# Patient Record
Sex: Male | Born: 1935 | Race: White | Hispanic: No | Smoking: Never smoker
Health system: Southern US, Community
[De-identification: ages and names within clinical notes are randomized; demographics above are authoritative.]

## PROBLEM LIST (undated history)

## (undated) DIAGNOSIS — Z87438 Personal history of other diseases of male genital organs: Secondary | ICD-10-CM

## (undated) DIAGNOSIS — Z79899 Other long term (current) drug therapy: Secondary | ICD-10-CM

## (undated) DIAGNOSIS — E559 Vitamin D deficiency, unspecified: Secondary | ICD-10-CM

## (undated) DIAGNOSIS — Z87442 Personal history of urinary calculi: Secondary | ICD-10-CM

## (undated) DIAGNOSIS — R350 Frequency of micturition: Secondary | ICD-10-CM

## (undated) DIAGNOSIS — R030 Elevated blood-pressure reading, without diagnosis of hypertension: Secondary | ICD-10-CM

## (undated) DIAGNOSIS — C61 Malignant neoplasm of prostate: Secondary | ICD-10-CM

## (undated) DIAGNOSIS — N529 Male erectile dysfunction, unspecified: Secondary | ICD-10-CM

## (undated) HISTORY — PX: EXTRACORPOREAL SHOCK WAVE LITHOTRIPSY: SHX1557

## (undated) HISTORY — DX: Vitamin D deficiency, unspecified: E55.9

## (undated) HISTORY — PX: OTHER SURGICAL HISTORY: SHX169

## (undated) HISTORY — DX: Other long term (current) drug therapy: Z79.899

---

## 1998-05-25 ENCOUNTER — Ambulatory Visit (HOSPITAL_COMMUNITY): Admission: RE | Admit: 1998-05-25 | Discharge: 1998-05-25 | Payer: Self-pay | Admitting: Urology

## 1998-06-15 ENCOUNTER — Ambulatory Visit (HOSPITAL_COMMUNITY): Admission: RE | Admit: 1998-06-15 | Discharge: 1998-06-15 | Payer: Self-pay | Admitting: Urology

## 1998-12-24 ENCOUNTER — Emergency Department (HOSPITAL_COMMUNITY): Admission: EM | Admit: 1998-12-24 | Discharge: 1998-12-24 | Payer: Self-pay | Admitting: Emergency Medicine

## 1998-12-24 ENCOUNTER — Encounter: Payer: Self-pay | Admitting: Emergency Medicine

## 1999-05-02 ENCOUNTER — Encounter: Payer: Self-pay | Admitting: Internal Medicine

## 1999-05-02 ENCOUNTER — Ambulatory Visit (HOSPITAL_COMMUNITY): Admission: RE | Admit: 1999-05-02 | Discharge: 1999-05-02 | Payer: Self-pay | Admitting: Internal Medicine

## 1999-12-04 ENCOUNTER — Encounter (INDEPENDENT_AMBULATORY_CARE_PROVIDER_SITE_OTHER): Payer: Self-pay

## 1999-12-04 ENCOUNTER — Other Ambulatory Visit: Admission: RE | Admit: 1999-12-04 | Discharge: 1999-12-04 | Payer: Self-pay | Admitting: Gastroenterology

## 2001-02-12 ENCOUNTER — Ambulatory Visit (HOSPITAL_COMMUNITY): Admission: RE | Admit: 2001-02-12 | Discharge: 2001-02-12 | Payer: Self-pay | Admitting: Urology

## 2001-02-12 ENCOUNTER — Encounter: Payer: Self-pay | Admitting: Urology

## 2003-10-27 ENCOUNTER — Encounter: Admission: RE | Admit: 2003-10-27 | Discharge: 2003-10-27 | Payer: Self-pay | Admitting: Internal Medicine

## 2004-01-02 ENCOUNTER — Ambulatory Visit (HOSPITAL_COMMUNITY): Admission: RE | Admit: 2004-01-02 | Discharge: 2004-01-02 | Payer: Self-pay | Admitting: Urology

## 2005-05-07 ENCOUNTER — Ambulatory Visit: Payer: Self-pay | Admitting: Internal Medicine

## 2005-08-22 ENCOUNTER — Ambulatory Visit (HOSPITAL_COMMUNITY): Admission: RE | Admit: 2005-08-22 | Discharge: 2005-08-22 | Payer: Self-pay | Admitting: Urology

## 2006-05-27 ENCOUNTER — Ambulatory Visit: Payer: Self-pay | Admitting: Gastroenterology

## 2006-06-03 ENCOUNTER — Encounter (INDEPENDENT_AMBULATORY_CARE_PROVIDER_SITE_OTHER): Payer: Self-pay | Admitting: Gastroenterology

## 2006-06-03 ENCOUNTER — Ambulatory Visit: Payer: Self-pay | Admitting: Gastroenterology

## 2007-02-05 ENCOUNTER — Ambulatory Visit (HOSPITAL_COMMUNITY): Admission: RE | Admit: 2007-02-05 | Discharge: 2007-02-05 | Payer: Self-pay | Admitting: Urology

## 2007-03-26 ENCOUNTER — Ambulatory Visit (HOSPITAL_COMMUNITY): Admission: RE | Admit: 2007-03-26 | Discharge: 2007-03-26 | Payer: Self-pay | Admitting: Urology

## 2007-06-10 ENCOUNTER — Emergency Department (HOSPITAL_COMMUNITY): Admission: EM | Admit: 2007-06-10 | Discharge: 2007-06-10 | Payer: Self-pay | Admitting: Emergency Medicine

## 2009-05-24 ENCOUNTER — Encounter (INDEPENDENT_AMBULATORY_CARE_PROVIDER_SITE_OTHER): Payer: Self-pay | Admitting: *Deleted

## 2009-11-21 ENCOUNTER — Ambulatory Visit (HOSPITAL_BASED_OUTPATIENT_CLINIC_OR_DEPARTMENT_OTHER): Admission: RE | Admit: 2009-11-21 | Discharge: 2009-11-21 | Payer: Self-pay | Admitting: Urology

## 2011-03-21 ENCOUNTER — Other Ambulatory Visit: Payer: Self-pay | Admitting: Urology

## 2011-03-21 ENCOUNTER — Ambulatory Visit
Admission: RE | Admit: 2011-03-21 | Discharge: 2011-03-21 | Disposition: A | Discharge status: Home / Self Care | Payer: Medicare Other | Source: Ambulatory Visit | Attending: Urology | Admitting: Urology

## 2011-03-21 DIAGNOSIS — Z01811 Encounter for preprocedural respiratory examination: Secondary | ICD-10-CM

## 2011-03-22 ENCOUNTER — Other Ambulatory Visit: Payer: Self-pay | Admitting: Urology

## 2011-03-22 ENCOUNTER — Ambulatory Visit (HOSPITAL_BASED_OUTPATIENT_CLINIC_OR_DEPARTMENT_OTHER)
Admission: RE | Admit: 2011-03-22 | Discharge: 2011-03-22 | Disposition: A | Discharge status: Home / Self Care | Payer: Medicare Other | Source: Ambulatory Visit | Attending: Urology | Admitting: Urology

## 2011-03-22 DIAGNOSIS — Z01810 Encounter for preprocedural cardiovascular examination: Secondary | ICD-10-CM | POA: Insufficient documentation

## 2011-03-22 DIAGNOSIS — Z01812 Encounter for preprocedural laboratory examination: Secondary | ICD-10-CM | POA: Insufficient documentation

## 2011-03-22 DIAGNOSIS — C61 Malignant neoplasm of prostate: Secondary | ICD-10-CM | POA: Insufficient documentation

## 2011-03-22 LAB — POCT HEMOGLOBIN-HEMACUE: Hemoglobin: 16.5 g/dL (ref 13.0–17.0)

## 2011-03-28 NOTE — Op Note (Signed)
  Dwayne Stevenson, Dwayne Stevenson                 ACCOUNT NO.:  [id number]  MEDICAL RECORD NO.:  [id number]            PATIENT TYPE:  LOCATION:                                 FACILITY:  PHYSICIAN:  Maretta Bees. Vonita Moss, M.D.     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  PROCEDURE:  Transurethral ultrasound and biopsy the prostate.  SURGEON:  Maretta Bees. Vonita Moss, M.D.  ANESTHESIA:  MAC.  INDICATIONS:  This gentleman has had previous biopsies because of pre PSA elevation and he has had high-grade PIN and a focus of atypia and he needs followup biopsy done at this time.  DESCRIPTION OF PROCEDURE:  The patient was brought to the operating room and placed in a lateral decubitus position and given MAC.  Transrectal ultrasound probe was inserted in the rectum.  The prostate was scanned and he had prostatic calcifications but no definite hypoechoic area and the prostatic volume of just over 50 cc.  Transrectal ultrasound-guided biopsies were taken from the mid base and apex bilaterally with medial and lateral biopsies in each area for a total of 12 cores in 12 bottles. He was then taken to the recovery room in good condition having tolerated the procedure well.     Maretta Bees. Vonita Moss, M.D.     LJP/MEDQ  D:  03/22/2011  T:  03/22/2011  Job:  [id number]  Electronically Signed by Larey Dresser M.D. on 03/28/2011 02:26:47 PM

## 2011-07-12 ENCOUNTER — Encounter: Payer: Self-pay | Admitting: Gastroenterology

## 2012-10-01 ENCOUNTER — Encounter: Payer: Self-pay | Admitting: Gastroenterology

## 2013-02-05 ENCOUNTER — Other Ambulatory Visit: Payer: Self-pay | Admitting: Urology

## 2013-05-31 ENCOUNTER — Encounter (HOSPITAL_COMMUNITY): Admission: RE | Payer: Self-pay | Source: Ambulatory Visit

## 2013-05-31 ENCOUNTER — Ambulatory Visit (HOSPITAL_COMMUNITY): Admission: RE | Admit: 2013-05-31 | Payer: Medicare Other | Source: Ambulatory Visit | Admitting: Urology

## 2013-05-31 SURGERY — BIOPSY, PROSTATE, RECTAL APPROACH, WITH US GUIDANCE
Anesthesia: Monitor Anesthesia Care

## 2013-10-26 ENCOUNTER — Other Ambulatory Visit: Payer: Self-pay | Admitting: Urology

## 2013-11-29 ENCOUNTER — Encounter (HOSPITAL_BASED_OUTPATIENT_CLINIC_OR_DEPARTMENT_OTHER): Payer: Self-pay | Admitting: *Deleted

## 2013-11-29 NOTE — Progress Notes (Signed)
NPO AFTER MN. ARRIVE AT 0845. NEEDS HG . WILL DO FLEET ENEMA AM DOS. 

## 2013-12-01 NOTE — H&P (Signed)
  History of Present Illness  Dwayne Stevenson is a 78 year old previously followed by Dr. Vonita Moss and Dr. Annabell Howells with the following urologic history:  1) Prostate cancer: He was diagnosed with prostate cancer in November 2010 by Dr. Vonita Moss and found to have very low risk clinically localized prostate cancer at that time.  After discussing options for management, he elected to proceed with active surveillance.  Initial diagnosis: November 2010 TNM stage: cT1c Nx Mx PSA at diagnosis: 6.77 Gleason score: 3+3=6 Biopsy (11/21/09): 12 core biopsy -- 1 out of 12 cores positive -- L apex (8%), HGPIN, chronic inflammation, Vol 51 cc PSAD: 0.13  Surveillance: Mar 2012: 12 core biopsy -- R lateral apex (3+3=6, 10%), L lateral base (focus of atypia), Vol 58 cc  2) BPH/LUTS: His baseline symptoms include frequency, urgency, hesitancy, and weak stream. Current treatment: Tamsulosin 0.4 mg  3) Urolithiasis: He has a long standing history of calcium oxalate urolithiasis.   Prior treatment: 02/12/11: R ESWL 01/02/04: R ESWL  08/22/05: R ESWL 03/26/07: R ESWL  4) Erectile dysfunction:  Current treatment: Viagra 50 mg prn      Past Medical History Problems  1. Benign Prostatic Hypertrophy With Urinary Obstruction 600.01 2. Nephrolithiasis 592.0 3. Prostate Cancer 185  Surgical History Problems  1. History of  Biopsy Of The Prostate Needle 2. History of  Biopsy Of The Prostate Needle 3. History of  Lithotripsy  Current Meds 1. Multi-Vitamin TABS; Therapy: (Recorded:12Jun2013) to 2. Tamsulosin HCl 0.4 MG Oral Capsule; Take 1 capsule by mouth at bedtime; Therapy:  12Jun2013 to (Evaluate:07Jun2014); Last Rx:12Jun2013 3. Viagra 100 MG Oral Tablet; TAKE AS DIRECTED; Therapy: 02Dec2008 to (Last  Rx:12Jun2013)  Allergies Medication  1. No Known Drug Allergies  Family History Problems  1. Paternal history of  Death In The Family Father Old AgeAge of death was 87 2. Maternal history of  Death In  The Family Mother Old Age Age of death was 56 3. Family history of  Family Health Status Number Of Children 1 son and 3 daughters  Social History Problems  1. Alcohol Use 2. Marital History - Single 3. Never A Smoker 4. Occupation: ret. Denied  5. Tobacco Use V15.82  Review of Systems  Genitourinary: no hematuria.  Constitutional: no fever and no recent weight loss.      Physical Exam Constitutional: Well nourished and well developed . No acute distress.  Rectal: Rectal exam demonstrates normal sphincter tone, no tenderness and no masses. Prostate size is estimated to be 65 g. The prostate has no nodularity and is not tender. The left seminal vesicle is nonpalpable. The right seminal vesicle is nonpalpable. The perineum is normal on inspection.     Selected Results  PSA 16Dec2013 09:26AM Heloise Purpura  SPECIMEN TYPE: BLOOD   Test Name Result Flag Reference  PSA 6.15 ng/mL H <=4.00  Test Methodology: ECLIA PSA (Electrochemiluminescence Immunoassay)   PSA Flowsheet [Data Includes: Last 3 Years]   ** Printed in Appendix #1 below.    PVR: 52 cc  Assessment Assessed  1. Prostate Cancer 185   Discussion/Summary  1. Prostate cancer:    I have recommended proceeding with a prostate needle biopsy under transrectal ultrasound guidance. We have discussed the potential risks and complications of this procedure including but not limited to bleeding, infection (in rare instances sepsis), urinary retention, and failure to diagnosis cancer.

## 2013-12-02 ENCOUNTER — Ambulatory Visit (HOSPITAL_BASED_OUTPATIENT_CLINIC_OR_DEPARTMENT_OTHER)
Admission: RE | Admit: 2013-12-02 | Discharge: 2013-12-02 | Disposition: A | Discharge status: Home / Self Care | Payer: Medicare Other | Source: Ambulatory Visit | Attending: Urology | Admitting: Urology

## 2013-12-02 ENCOUNTER — Encounter (HOSPITAL_BASED_OUTPATIENT_CLINIC_OR_DEPARTMENT_OTHER): Payer: Self-pay | Admitting: *Deleted

## 2013-12-02 ENCOUNTER — Encounter (HOSPITAL_BASED_OUTPATIENT_CLINIC_OR_DEPARTMENT_OTHER)
Admission: RE | Disposition: A | Discharge status: Home / Self Care | Payer: Self-pay | Source: Ambulatory Visit | Attending: Urology

## 2013-12-02 ENCOUNTER — Encounter (HOSPITAL_BASED_OUTPATIENT_CLINIC_OR_DEPARTMENT_OTHER): Payer: Medicare Other | Admitting: Anesthesiology

## 2013-12-02 ENCOUNTER — Ambulatory Visit (HOSPITAL_BASED_OUTPATIENT_CLINIC_OR_DEPARTMENT_OTHER): Payer: Medicare Other | Admitting: Anesthesiology

## 2013-12-02 DIAGNOSIS — Z79899 Other long term (current) drug therapy: Secondary | ICD-10-CM | POA: Insufficient documentation

## 2013-12-02 DIAGNOSIS — N401 Enlarged prostate with lower urinary tract symptoms: Secondary | ICD-10-CM | POA: Insufficient documentation

## 2013-12-02 DIAGNOSIS — R39198 Other difficulties with micturition: Secondary | ICD-10-CM | POA: Insufficient documentation

## 2013-12-02 DIAGNOSIS — N138 Other obstructive and reflux uropathy: Secondary | ICD-10-CM | POA: Insufficient documentation

## 2013-12-02 DIAGNOSIS — N139 Obstructive and reflux uropathy, unspecified: Secondary | ICD-10-CM | POA: Insufficient documentation

## 2013-12-02 DIAGNOSIS — R350 Frequency of micturition: Secondary | ICD-10-CM | POA: Insufficient documentation

## 2013-12-02 DIAGNOSIS — N529 Male erectile dysfunction, unspecified: Secondary | ICD-10-CM | POA: Insufficient documentation

## 2013-12-02 DIAGNOSIS — N209 Urinary calculus, unspecified: Secondary | ICD-10-CM | POA: Insufficient documentation

## 2013-12-02 DIAGNOSIS — C61 Malignant neoplasm of prostate: Secondary | ICD-10-CM | POA: Insufficient documentation

## 2013-12-02 DIAGNOSIS — R3915 Urgency of urination: Secondary | ICD-10-CM | POA: Insufficient documentation

## 2013-12-02 DIAGNOSIS — R3911 Hesitancy of micturition: Secondary | ICD-10-CM | POA: Insufficient documentation

## 2013-12-02 DIAGNOSIS — F411 Generalized anxiety disorder: Secondary | ICD-10-CM | POA: Insufficient documentation

## 2013-12-02 HISTORY — DX: Frequency of micturition: R35.0

## 2013-12-02 HISTORY — DX: Personal history of other diseases of male genital organs: Z87.438

## 2013-12-02 HISTORY — DX: Malignant neoplasm of prostate: C61

## 2013-12-02 HISTORY — DX: Elevated blood-pressure reading, without diagnosis of hypertension: R03.0

## 2013-12-02 HISTORY — DX: Personal history of urinary calculi: Z87.442

## 2013-12-02 HISTORY — PX: PROSTATE BIOPSY: SHX241

## 2013-12-02 HISTORY — DX: Male erectile dysfunction, unspecified: N52.9

## 2013-12-02 SURGERY — BIOPSY, PROSTATE, RECTAL APPROACH, WITH US GUIDANCE
Anesthesia: Monitor Anesthesia Care | Site: Prostate

## 2013-12-02 MED ORDER — FENTANYL CITRATE 0.05 MG/ML IJ SOLN
INTRAMUSCULAR | Status: DC | PRN
  Administered 2013-12-02 (×8): 12.5 ug via INTRAVENOUS

## 2013-12-02 MED ORDER — LIDOCAINE HCL 2 % IJ SOLN
INTRAMUSCULAR | Status: DC | PRN
  Administered 2013-12-02: 10 mL

## 2013-12-02 MED ORDER — KETOROLAC TROMETHAMINE 30 MG/ML IJ SOLN
INTRAMUSCULAR | Status: DC | PRN
  Administered 2013-12-02: 30 mg via INTRAVENOUS

## 2013-12-02 MED ORDER — FENTANYL CITRATE 0.05 MG/ML IJ SOLN
25.0000 ug | INTRAMUSCULAR | Status: DC | PRN
  Filled 2013-12-02: qty 1

## 2013-12-02 MED ORDER — ONDANSETRON HCL 4 MG/2ML IJ SOLN
INTRAMUSCULAR | Status: DC | PRN
  Administered 2013-12-02: 4 mg via INTRAVENOUS

## 2013-12-02 MED ORDER — PROPOFOL 10 MG/ML IV BOLUS
INTRAVENOUS | Status: DC | PRN
  Administered 2013-12-02: 20 mg via INTRAVENOUS
  Administered 2013-12-02: 30 mg via INTRAVENOUS

## 2013-12-02 MED ORDER — LACTATED RINGERS IV SOLN
INTRAVENOUS | Status: DC
  Administered 2013-12-02: 09:00:00 via INTRAVENOUS
  Filled 2013-12-02: qty 1000

## 2013-12-02 MED ORDER — PROPOFOL 10 MG/ML IV EMUL
INTRAVENOUS | Status: DC | PRN
  Administered 2013-12-02: 100 ug/kg/min via INTRAVENOUS

## 2013-12-02 MED ORDER — LACTATED RINGERS IV SOLN
INTRAVENOUS | Status: DC | PRN
  Administered 2013-12-02 (×2): via INTRAVENOUS

## 2013-12-02 MED ORDER — EPHEDRINE SULFATE 50 MG/ML IJ SOLN: INTRAMUSCULAR | Status: DC | PRN

## 2013-12-02 MED ORDER — LACTATED RINGERS IV SOLN
INTRAVENOUS | Status: DC
  Filled 2013-12-02: qty 1000

## 2013-12-02 MED ORDER — FLEET ENEMA 7-19 GM/118ML RE ENEM
1.0000 | ENEMA | Freq: Once | RECTAL | Status: AC
  Administered 2013-12-02: 1 via RECTAL
  Filled 2013-12-02: qty 1

## 2013-12-02 MED ORDER — FENTANYL CITRATE 0.05 MG/ML IJ SOLN
INTRAMUSCULAR | Status: AC
  Filled 2013-12-02: qty 4

## 2013-12-02 SURGICAL SUPPLY — 10 items
DRESSING TELFA 8X3 (GAUZE/BANDAGES/DRESSINGS) IMPLANT
GLOVE BIO SURGEON STRL SZ7.5 (GLOVE) ×2 IMPLANT
NDL SAFETY ECLIPSE 18X1.5 (NEEDLE) ×1 IMPLANT
NEEDLE HYPO 18GX1.5 SHARP (NEEDLE) ×1
NEEDLE SPNL 22GX7 QUINCKE BK (NEEDLE) ×2 IMPLANT
SURGILUBE 2OZ TUBE FLIPTOP (MISCELLANEOUS) ×2 IMPLANT
SYR 20CC LL (SYRINGE) ×2 IMPLANT
TOWEL OR 17X24 6PK STRL BLUE (TOWEL DISPOSABLE) ×2 IMPLANT
UNDERPAD 30X30 INCONTINENT (UNDERPADS AND DIAPERS) ×2 IMPLANT
WATER STERILE IRR 500ML POUR (IV SOLUTION) ×2 IMPLANT

## 2013-12-02 NOTE — Transfer of Care (Signed)
Immediate Anesthesia Transfer of Care Note  Patient: Dwayne Stevenson  Procedure(s) Performed: Procedure(s) (LRB): BIOPSY TRANSRECTAL ULTRASONIC PROSTATE (TUBP) (N/A)  Patient Location: PACU  Anesthesia Type: MAC  Level of Consciousness: awake, sedated, patient cooperative and responds to stimulation  Airway & Oxygen Therapy: Patient Spontanous Breathing and Patient connected to face mask oxygen  Post-op Assessment: Report given to PACU RN, Post -op Vital signs reviewed and stable and Patient moving all extremities  Post vital signs: Reviewed and stable  Complications: No apparent anesthesia complications

## 2013-12-02 NOTE — Op Note (Signed)
Preoperative diagnosis:  1. Prostate cancer  Postoperative diagnosis: 1. Prostate cancer  Procedure(s): 1. Transrectal ultrasound of the prostate 2. Prostate needle biopsy  Surgeon: Dr. Rolly Salter, Jr  Complications: None  EBL: Minimal  Specimens: Right lateral apex, right apex, right lateral mid, right mid, right lateral base, right base, left lateral apex, left apex, left lateral mid, left mid, left lateral base, left base prostate biopsies  Disposition of specimens: Pathology   Indication: Mr. Dwayne Stevenson is a 78 year old gentleman with a history of prostate cancer managed with active surveillance. He presents today for a surveillance biopsy.  He has been extremely anxious and has delayed surveillance biopsy for multiple months.  Due to his anxiety, he has elected to have a biopsy performed under IV sedation.  The potential risks, complications, and expected recovery processes have been explained in detail and he has given informed consent to proceed.  Description of procedure:  The patient was taken to the operating room and administered IV sedation.  He had received oral antibiotic prophylaxis yesterday and this morning.  He was placed in the left lateral decubitus position. A digital rectal exam was performed and revealed no nodularity or induration of the prostate.  The transrectal ultrasound probe was then inserted into the rectum and the prostate was visualized.  Ultrasound imaging of the prostate demonstrated to be homogeneous without obvious abnormalities.  The seminal vesicles appeared normal.  The prostate volume was measured at 61.9 cc.  10 cc of 2% lidocaine were used to perform a periprostatic nerve block. 12 biopsy cores were then obtained with cores taken from the right lateral apex, right apex, right lateral mid, right mid, right lateral base, right base, left lateral apex, left apex, left lateral mid, left mid, left lateral base, and left base.  The biopsy cores were  placed in formalin and sent for permanent pathologic analysis. He tolerated the procedure well and without complications.

## 2013-12-02 NOTE — Anesthesia Postprocedure Evaluation (Signed)
  Anesthesia Post-op Note  Patient: Dwayne Stevenson  Procedure(s) Performed: Procedure(s) (LRB): BIOPSY TRANSRECTAL ULTRASONIC PROSTATE (TUBP) (N/A)  Patient Location: PACU  Anesthesia Type: MAC  Level of Consciousness: awake and alert   Airway and Oxygen Therapy: Patient Spontanous Breathing  Post-op Pain: mild  Post-op Assessment: Post-op Vital signs reviewed, Patient's Cardiovascular Status Stable, Respiratory Function Stable, Patent Airway and No signs of Nausea or vomiting  Last Vitals:  Filed Vitals:   12/02/13 1044  BP: 111/70  Pulse: 101  Temp: 36.9 C  Resp: 15    Post-op Vital Signs: stable   Complications: No apparent anesthesia complications

## 2013-12-02 NOTE — Anesthesia Preprocedure Evaluation (Addendum)
Anesthesia Evaluation  Patient identified by MRN, date of birth, ID band Patient awake    Reviewed: Allergy & Precautions, H&P , NPO status , Patient's Chart, lab work & pertinent test results  Airway Mallampati: II TM Distance: >3 FB Neck ROM: full    Dental no notable dental hx. (+) Teeth Intact and Dental Advisory Given   Pulmonary neg pulmonary ROS,  breath sounds clear to auscultation  Pulmonary exam normal       Cardiovascular Exercise Tolerance: Good negative cardio ROS  Rhythm:regular Rate:Normal     Neuro/Psych negative neurological ROS  negative psych ROS   GI/Hepatic negative GI ROS, Neg liver ROS,   Endo/Other  negative endocrine ROS  Renal/GU negative Renal ROS  negative genitourinary   Musculoskeletal   Abdominal   Peds  Hematology negative hematology ROS (+)   Anesthesia Other Findings   Reproductive/Obstetrics negative OB ROS                          Anesthesia Physical Anesthesia Plan  ASA: II  Anesthesia Plan: MAC   Post-op Pain Management:    Induction:   Airway Management Planned: Simple Face Mask  Additional Equipment:   Intra-op Plan:   Post-operative Plan:   Informed Consent: I have reviewed the patients History and Physical, chart, labs and discussed the procedure including the risks, benefits and alternatives for the proposed anesthesia with the patient or authorized representative who has indicated his/her understanding and acceptance.   Dental Advisory Given  Plan Discussed with: CRNA and Surgeon  Anesthesia Plan Comments:         Anesthesia Quick Evaluation

## 2013-12-03 ENCOUNTER — Encounter (HOSPITAL_BASED_OUTPATIENT_CLINIC_OR_DEPARTMENT_OTHER): Payer: Self-pay | Admitting: Urology

## 2013-12-03 LAB — POCT HEMOGLOBIN-HEMACUE: Hemoglobin: 17.1 g/dL — ABNORMAL HIGH (ref 13.0–17.0)

## 2014-06-15 ENCOUNTER — Other Ambulatory Visit: Payer: Self-pay

## 2014-08-31 ENCOUNTER — Other Ambulatory Visit: Payer: Self-pay

## 2015-01-09 ENCOUNTER — Encounter: Payer: Self-pay | Admitting: Gastroenterology

## 2022-08-05 ENCOUNTER — Encounter: Payer: Self-pay | Admitting: Gastroenterology

## 2022-09-03 ENCOUNTER — Ambulatory Visit: Payer: Medicare Other | Admitting: Gastroenterology

## 2022-11-14 ENCOUNTER — Encounter: Payer: Self-pay | Admitting: *Deleted

## 2022-11-18 ENCOUNTER — Encounter: Payer: Self-pay | Admitting: Neurology

## 2022-11-18 ENCOUNTER — Ambulatory Visit (INDEPENDENT_AMBULATORY_CARE_PROVIDER_SITE_OTHER): Payer: Medicare Other | Admitting: Neurology

## 2022-11-18 VITALS — BP 141/85 | HR 108 | Ht 71.0 in | Wt 157.0 lb

## 2022-11-18 DIAGNOSIS — R269 Unspecified abnormalities of gait and mobility: Secondary | ICD-10-CM | POA: Diagnosis not present

## 2022-11-18 NOTE — Progress Notes (Signed)
Subjective:    Patient ID: Dwayne Stevenson is a 87 y.o. male.  HPI    Star Age, MD, PhD [facility] [address], [address] [place], [place] [address]  Dear Thurmond Butts,  I saw your patient, Dwayne Stevenson, upon your kind request, in my neurologic clinic today for initial consultation of his gait disorder.  The patient is unaccompanied today.  As you know, Dwayne Stevenson is an 87 year old right-handed gentleman with an underlying medical history of kidney stones, BPH, prostate cancer, vitamin D deficiency, arthritis, and lumbar degenerative disc disease, who reports an approximately 1 year history of difficulty with his gait and balance, he feels unsteady, he has felt wobbly.  Thankfully, he has not fallen with the exception of one time he slid backwards as his lawn more was going backwards and he had a controlled fall onto the right shoulder, loss of consciousness or head injuries.  He lives alone, he is divorced, he has 4 grown children, 3 of them in [place] and 1 daughter a little further from [place] but still in [place].  He is a non-smoker, he admits that he does not drink a whole lot of water, estimates that he drinks about 1 or 2 small bottles of water, 8 ounce size.  He does drink some beer, light beer typically 1 or 2/day, sometimes 3.  He drinks caffeine in the form of coffee, about 1-1/2 cups in the morning and 1 soda with lunch typically.  He denies any headaches, pain in any major joints or low back pain.  He stays very active.  As far as discussion about normal pressure hydrocephalus, he has had a discussion with his previous neurologist, he is not interested in pursuing any surgical procedures.  His kids gave him a cane but he has not used it.  He has not gone to the eye doctor on a regular basis in the past but had an eye examination in January of this year, has a history of glaucoma and some nerve damage on the right eye he states.  He has no  eyedrops for glaucoma.  I reviewed your office notes from 09/06/2022, as well as 08/20/2022.  He had blood work through your office including uric acid, ESR, ANA, rheumatoid factor, TSH, T3, free T4, RPR, CMP, CBC with differential, and PTH.  I reviewed blood test results from 08/20/2022.  Uric acid was 5.0, TSH 2.088, vitamin D 34.69, PTH was 27.4, T3 66.6, CMP showed benign findings with BUN 11, creatinine 0.9, alk phos 60, AST 29, ALT 20.  CBC with differential and platelets benign.  Lipid panel showed total cholesterol of 159, triglycerides 74, HDL 63, LDL 81.  He had a head CT without contrast through [facility] on 08/23/2021 and I reviewed the results: IMPRESSION:  1. Moderate ventriculomegaly, slightly out of proportion to the  degree of sulcal enlargement. This could be secondary to central  predominant volume loss; however, normal pressure hydrocephalus  should be considered given the patient's reported imbalance and  memory loss.  2. Mild for age atrophy and chronic microvascular ischemic disease.   He saw Dr. Creig Hines, neurologist with [facility], on 09/05/2021 and was found to have degenerative lumbar spine disease, he was not felt to have a telltale signs of Parkinson's disease or normal pressure hydrocephalus.  His Past Medical History Is Significant For: Past Medical History:  Diagnosis Date   Frequency of urination    High risk medication use  History of BPH    W/ OBSTRUCTION   History of kidney stones    Organic impotence    Prostate cancer ([place])    Vitamin D deficiency    White coat syndrome without hypertension     His Past Surgical History Is Significant For: Past Surgical History:  Procedure Laterality Date    TRANSRECTAL ULTRASOUND PROSTATE BX  11-21-2009 &   03-22-2011   EXTRACORPOREAL SHOCK WAVE LITHOTRIPSY     PROSTATE BIOPSY N/A 12/02/2013   Procedure: BIOPSY TRANSRECTAL ULTRASONIC PROSTATE (TUBP);  Surgeon: Dutch Gray, MD;  Location[facility];  Service: Urology;  Laterality: N/A;    His Family History Is Significant For: Family History  Problem Relation Age of Onset   Parkinson's disease Neg Hx     His Social History Is Significant For: Social History   Socioeconomic History   Marital status: Divorced    Spouse name: Not on file   Number of children: Not on file   Years of education: Not on file   Highest education level: Not on file  Occupational History   Not on file  Tobacco Use   Smoking status: Never   Smokeless tobacco: Never  Vaping Use   Vaping Use: Not on file  Substance and Sexual Activity   Alcohol use: Yes    Alcohol/week: 2.0 - 3.0 standard drinks of alcohol    Types: 2 - 3 Cans of beer per week   Drug use: No   Sexual activity: Not on file  Other Topics Concern   Not on file  Social History Narrative   Not on file   Social Determinants of Health   Financial Resource Strain: Not on file  Food Insecurity: Not on file  Transportation Needs: Not on file  Physical Activity: Not on file  Stress: Not on file  Social Connections: Not on file    His Allergies Are:  No Known Allergies:   His Current Medications Are:  Outpatient Encounter Medications as of 11/18/2022  Medication Sig   tamsulosin (FLOMAX) 0.4 MG CAPS capsule Take 0.4 mg by mouth daily.   No facility-administered encounter medications on file as of 11/18/2022.  :   Review of Systems:  Out of a complete 14 point review of systems, all are reviewed and negative with the exception of these symptoms as listed below:  Review of Systems  Neurological:        Pt here because gait is off Pt shuffles when walking Pt states balance is off .     Objective:  Neurological Exam  Physical Exam Physical Examination:   Vitals:   11/18/22 0813  BP: (!) 141/85  Pulse: (!) 108    General Examination: The patient is a very pleasant 87 y.o. male in no acute distress. He appears well-developed and well-nourished and very well  groomed.   HEENT: Normocephalic, atraumatic, pupils are equal, round and reactive to light, status post bilateral cataract repairs.  Extraocular tracking is good without limitation to gaze excursion or nystagmus noted. Hearing is grossly intact. Face is symmetric with normal facial animation. Speech is clear with no dysarthria noted. There is no hypophonia. There is no lip, neck/head, jaw or voice tremor. Neck is supple with full range of passive and active motion. There are no carotid bruits on auscultation. Oropharynx exam reveals: mild mouth dryness, adequate dental hygiene. Tongue protrudes centrally and palate elevates symmetrically.   Chest: Clear to auscultation without wheezing, rhonchi or crackles noted.  Heart:  S1+S2+0, regular and normal without murmurs, rubs or gallops noted.   Abdomen: Soft, non-tender and non-distended.  Extremities: There is no pitting edema in the distal lower extremities bilaterally.   Skin: Warm and dry without trophic changes noted.  Mild chronic appearing bruising on the Stevenson.  Musculoskeletal: exam reveals arthritic changes in both Stevenson.    Neurologically:  Mental status: The patient is awake, alert and oriented in all 4 spheres. His immediate and remote memory, attention, language skills and fund of knowledge are appropriate. There is no evidence of aphasia, agnosia, apraxia or anomia. Speech is clear with normal prosody and enunciation. Thought process is linear. Mood is normal and affect is normal.  Cranial nerves II - XII are as described above under HEENT exam.  Motor exam: Normal bulk, strength and tone is noted. There is no obvious action or resting tremor.  No postural tremor, no drift or rebound.  Reflexes are 1+ in the upper extremities, trace in the knees and absent in the ankles. Fine motor skills and coordination: grossly intact.  Normal for age finger taps and hand movements and rapid alternating patting as well as foot taps. Cerebellar  testing: No dysmetria or intention tremor. There is no truncal or gait ataxia.  Normal finger-to-nose, normal heel-to-shin. Sensory exam: intact to light touch in the upper and lower extremities.  Gait, station and balance: He stands without difficulty, states slightly wider based.  He walks with mild insecurity, no shuffling, has preserved armswing, turns insecurely, no walking aid.   Assessment and Plan:  In summary, SHAMOND SKELTON is a very pleasant 87 year old right-handed gentleman with an underlying medical history of kidney stones, BPH, prostate cancer, vitamin D deficiency, arthritis, and lumbar degenerative disc disease, who presents for evaluation of his gait and balance disorder of approximately 1 years duration, history and examination are not telltale for Parkinson's disease or parkinsonism, NPH was also discussed today and has previously been discussed with him by his previous neurologist.  I feel that the chance is slim, in addition, he is not in favor of pursuing any invasive diagnostic testing or invasive treatment.  We talked about the need for a spinal tap for evaluation for NPH and ultimately the treatment would be a shunt, he is not currently interested in pursuing this and I completely understand.  He is advised however to use his cane for gait safety.  He is encouraged to drink more water and stay better hydrated, about 6-8 small bottles of water are recommended.  He is further advised to cut back on his daily alcohol consumption even though he does not drink any liquor or wine. He has had some physical therapy instructions from his orthopedics in the past.  He stays very active and currently is not in favor of pursuing a physical therapy referral which I offered.  I do not see any indication for a repeat brain scan or additional blood work.  He is advised to follow-up with you as scheduled.  I answered all his questions today and he was in agreement.  Thank you very much for allowing me  to participate in the care of this nice patient. If I can be of any further assistance to you please do not hesitate to call me at [phone].  Sincerely,   Star Age, MD, PhD

## 2022-11-18 NOTE — Patient Instructions (Signed)
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    If we wanted to further pursue evaluation for a condition called normal pressure hydrocephalus, we would have to do invasive testing including a spinal tap, and ultimately the treatment for normal pressure hydrocephalus is the placement of a shunt in the brain. As you indicated, this is not something you would want to pursue, and I understand.    Please remember to stand up slowly and get your bearings first turn slowly, no bending down to pick anything, no heavy lifting, be extra careful at night and first thing in the morning. Also, be careful in the Bathroom and the kitchen.  I would recommend you consider using your cane for gait safety.  Remember to drink plenty of fluid, eat healthy meals and do not skip any meals.  Please drink about 6-8 of the small bottles of water, 8 ounce size each.  Try to eat protein with a every meal and eat a healthy snack such as fruit or nuts or yogurt in between meals. Try to keep a regular sleep-wake schedule and try to exercise daily, particularly in the form of walking, 20-30 minutes a day, if you can.    As far as your medications are concerned, I would like to suggest no new medications.   As far as diagnostic testing: I do not believe you need any additional testing from my end of things, I did not see any telltale signs of Parkinson's disease.    At this juncture, you can follow-up with your primary care.
